# Patient Record
Sex: Female | Born: 1965 | Race: Black or African American | Hispanic: No | Marital: Married | Smoking: Never smoker
Health system: Southern US, Community
[De-identification: ages and names within clinical notes are randomized; demographics above are authoritative.]

---

## 1997-10-31 ENCOUNTER — Observation Stay (HOSPITAL_COMMUNITY): Admission: AD | Admit: 1997-10-31 | Discharge: 1997-10-31 | Payer: Self-pay | Admitting: Obstetrics and Gynecology

## 1997-11-30 ENCOUNTER — Inpatient Hospital Stay (HOSPITAL_COMMUNITY): Admission: AD | Admit: 1997-11-30 | Discharge: 1997-11-30 | Payer: Self-pay | Admitting: Obstetrics and Gynecology

## 1997-12-02 ENCOUNTER — Ambulatory Visit (HOSPITAL_COMMUNITY): Admission: RE | Admit: 1997-12-02 | Discharge: 1997-12-02 | Payer: Self-pay | Admitting: Obstetrics and Gynecology

## 1998-01-15 ENCOUNTER — Inpatient Hospital Stay (HOSPITAL_COMMUNITY): Admission: AD | Admit: 1998-01-15 | Discharge: 1998-01-15 | Payer: Self-pay | Admitting: Obstetrics and Gynecology

## 1998-04-06 ENCOUNTER — Inpatient Hospital Stay (HOSPITAL_COMMUNITY): Admission: AD | Admit: 1998-04-06 | Discharge: 1998-04-08 | Payer: Self-pay | Admitting: *Deleted

## 1998-05-27 ENCOUNTER — Ambulatory Visit (HOSPITAL_COMMUNITY): Admission: RE | Admit: 1998-05-27 | Discharge: 1998-05-27 | Payer: Self-pay | Admitting: Obstetrics and Gynecology

## 1998-06-03 ENCOUNTER — Other Ambulatory Visit: Admission: RE | Admit: 1998-06-03 | Discharge: 1998-06-03 | Payer: Self-pay | Admitting: Obstetrics and Gynecology

## 1999-02-09 ENCOUNTER — Ambulatory Visit (HOSPITAL_BASED_OUTPATIENT_CLINIC_OR_DEPARTMENT_OTHER): Admission: RE | Admit: 1999-02-09 | Discharge: 1999-02-09 | Payer: Self-pay

## 2002-01-25 ENCOUNTER — Encounter: Payer: Self-pay | Admitting: Infectious Diseases

## 2002-01-25 ENCOUNTER — Ambulatory Visit (HOSPITAL_COMMUNITY): Admission: RE | Admit: 2002-01-25 | Discharge: 2002-01-25 | Payer: Self-pay | Admitting: Family Medicine

## 2004-07-27 ENCOUNTER — Other Ambulatory Visit: Admission: RE | Admit: 2004-07-27 | Discharge: 2004-07-27 | Payer: Self-pay | Admitting: Obstetrics and Gynecology

## 2004-11-02 ENCOUNTER — Encounter: Admission: RE | Admit: 2004-11-02 | Discharge: 2004-11-02 | Payer: Self-pay | Admitting: Obstetrics and Gynecology

## 2005-07-07 ENCOUNTER — Ambulatory Visit: Payer: Self-pay | Admitting: Cardiology

## 2005-07-07 ENCOUNTER — Encounter: Payer: Self-pay | Admitting: Cardiology

## 2005-07-07 ENCOUNTER — Inpatient Hospital Stay (HOSPITAL_COMMUNITY): Admission: EM | Admit: 2005-07-07 | Discharge: 2005-07-08 | Payer: Self-pay | Admitting: Emergency Medicine

## 2005-09-11 ENCOUNTER — Emergency Department (HOSPITAL_COMMUNITY): Admission: EM | Admit: 2005-09-11 | Discharge: 2005-09-11 | Payer: Self-pay | Admitting: Emergency Medicine

## 2008-07-24 ENCOUNTER — Encounter: Admission: RE | Admit: 2008-07-24 | Discharge: 2008-07-24 | Payer: Self-pay | Admitting: Obstetrics and Gynecology

## 2010-02-05 ENCOUNTER — Encounter: Admission: RE | Admit: 2010-02-05 | Discharge: 2010-02-05 | Payer: Self-pay | Admitting: Obstetrics and Gynecology

## 2011-02-12 NOTE — H&P (Signed)
Cassie Gillespie, Cassie Gillespie NO.:  [id number]   MEDICAL RECORD NO.:  [id number]          PATIENT TYPE:  INP   LOCATION:  2311                         FACILITY:  [facility]   PHYSICIAN:  Lonia Blood, M.D.       DATE OF BIRTH:  07/17/1966   DATE OF ADMISSION:  07/06/2005  DATE OF DISCHARGE:                                HISTORY & PHYSICAL   CHIEF COMPLAINT:  Puffy eyes.   HISTORY OF PRESENT ILLNESS:  Ms. Wisnewski is a 45 year old, African-American  woman without any significant past medical history.  She put on a different  kind of wig today, and after that she felt that her eyes were getting very  puffy.  She went home, changed the wig, and went back to work.  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  She was  rushed to the emergency room.  In the emergency room, she was thoroughly  evaluated for all possible causes of fever, but the chest x-ray was  negative, abdominal CT was negative, and we were called to admit her for a  fever of unknown origin.  Ms. Brent denies any cough.  Denies any  dysuria.  Denies any rashes.  Reports a sore throat.  She also reports that  her son has recently been sick.  She has never received a flu vaccine.   PAST MEDICAL HISTORY:  1.  Tonsillectomy.  2.  Hiatal hernia.  3.  Bilateral tubal ligation.   MEDICATIONS:  Multivitamins.   ALLERGIES:  No known drug allergies.   FAMILY HISTORY:  Positive for diabetes and hypertension.   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  Does not drink  alcohol.  Does not smoke cigarettes.  Does not use any intravenous drugs.  She is married.   REVIEW OF SYSTEMS:  As per HPI.  The 12 other systems are negative.   PHYSICAL EXAMINATION:  VITAL SIGNS:  Temperature 102.3; pulse 114;  respirations 28; blood pressure 102/46.  GENERAL:  Shows a well-developed, well-nourished woman  in no acute distress.  Alert, oriented to place, person, and time.  HEAD:  She has very swollen upper lip, puffy eyes.  Throat has erythema.  NECK:  Without JVD.  She has positive submandibular adenopathy bilaterally,  small and nontender.  CHEST:  Clear to auscultation bilaterally, without wheezes, rhonchi, or  crackles.  HEART:  Very tachycardiac.  2/6 systolic murmur, most likely a flow murmur.  ABDOMEN:  Soft, nontender, nondistended.  Bowel sounds are present.  She has  no CVA tenderness.  NEUROLOGIC:  Nonfocal.  She has 5/5 strength.  Pupils equal, round, and  reactive to light and accommodation.  Cranial nerves are intact.  SKIN:  There is no appreciable rash at this moment.  PSYCHIATRIC:  She appears to have intact and full memory, affect, judgment,  and insight.   LABORATORY VALUES ON ADMISSION:  White blood cell count 11,000, hemoglobin  12.2.  Urinalysis  within normal limits.  Lipase 18.  CMET within normal  limits.  Urine pregnancy is negative.  A CT of the abdomen shows some  reactive adenopathy.   IMPRESSION AND PLAN:  1.  A febrile illness of unknown clear etiology.  Appears to be a systemic      illness versus localized to the throat.  Differential includes a viral      illness versus a streptococcal illness, versus bacteremia of      unrecognized source.  Plan is to admit to the hospital.  Proceed with      aggressive intravenous fluid hydration, as the patient is hypotensive.      Also start on empiric antibiotics with vancomycin and Rocephin.  Will      also screen for influenza, nasal washings, streptococcal pneumonia      antigen in the urine, streptococcal rapid test from the throat.  DVT      prophylaxis will be done with Lovenox, and GI prophylaxis with Protonix.  2.  Angioedema secondary to an allergic reaction versus infectious process.      Plan is to give some intravenous steroids and follow up for the swelling      in the step-down unit.  The patient is  at risk of obturating the airway      if the edema progresses.      Lonia Blood, M.D.  Electronically Signed     SL/MEDQ  D:  07/07/2005  T:  07/07/2005  Job:  [id number]

## 2011-02-12 NOTE — Discharge Summary (Signed)
Cassie Gillespie, Cassie Gillespie NO.:  [id number]   MEDICAL RECORD NO.:  [id number]          PATIENT TYPE:  INP   LOCATION:  6743                         FACILITY:  [facility]   PHYSICIAN:  Hettie Holstein, D.O.    DATE OF BIRTH:  11-20-1965   DATE OF ADMISSION:  07/06/2005  DATE OF DISCHARGE:  07/09/2005                                 DISCHARGE SUMMARY   ADMISSION DIAGNOSIS:  Allergic reaction.   OTHER DIAGNOSES:  1.  Hypotension.  2.  Metrorrhagia.  3.  Anemia with hemoglobin of 9.7 this admission and MCV of 81.1, will      attempt to add iron studies to samples performed prior to discharge,      however, will defer extensive anemia workup to the outpatient setting as      the patient has had an episode of metrorrhagia, in addition to generous      IV hydration for hypotension and a likely component of hemodilution.  4.  Febrile illness of undetermined etiology status post extensive inpatient      evaluation including CT of the abdomen and pelvis as well as CT      maxillofacial studies, no radiographic evidence or clinical evidence of      intra-abdominal infectious process.  She had some suggestion of antritis      or adenitis on her scan, however, these were nonspecific.  Her CT      maxillofacial study revealed some post extraction findings in addition      to some nonspecific inflammatory findings of parotid glands.  5.  Questionable allergic reaction, it is unclear what the etiology of Ms.      Gillespie presenting complaint was, however, symptoms have resolved      now.  She has undergone extensive inpatient evaluation study.  She      appears to be medically stable for discharge.   STUDIES PERFORMED:  CT scan with findings as described above.  2D  echocardiogram which revealed a normal ejection fraction of 65%.  Her renal  function has remained stable with normal BUN and creatinine throughout her  hospitalization.  Some findings on the maxillofacial study, CT  scan,  revealed some left maxillary sinusitis as well as some asymmetry with  regards to her nonspecific findings.  Her symptoms have resolved at the time  of discharge.   DISCHARGE MEDICATIONS:  Zithromax to completion of therapy and over the  counter Tylenol.   DISPOSITION:  She has no primary care physician, she works here at [employer], we are providing her with contact information for [facility], Dr. Lynne Logan office, to call for a follow up appointment  within 1-2 weeks.  I have provided her with my contact information, fax  information for work, forms that she requested be filled out.  In addition,  I have recommended that she follow up with a gynecologist for this episode  of metrorrhagia and provided her our contact information if she develops any  difficulties over the next few days prior to follow up with a new primary  care Cassie Gillespie.  HISTORY OF PRESENT ILLNESS:  Please refer to the history and physical as  dictated by Dr. Lonia Blood, however, briefly Cassie Gillespie is a 45 year old  female without any significant past medical history.  She stated that after  trying on a wig, she promptly developed a funny sensation, she felt very  sick as though she was running a fever, felt quite weak, and rushed to the  emergency department.  She underwent thorough evaluation.  Her face was  swollen and puffy, according to the department, as well as Dr. Ellin Saba  evaluation.  She is admitted for further evaluation and management.   HOSPITAL COURSE:  She was admitted to the step down ICU due to hypotension  with a diastolic dropping into the 30s and 40s and systolic in the 80s.  She  did respond to IV fluids, she was empirically started on antibiotics and  cultured.  Her cultures revealed no source of infection.  Her imaging  studies were described above and her renal function remained within normal  limits.  I did discuss the CT findings with regards to the post  extraction  findings with Dr. Maryclare Bean.  In addition, I reviewed the parotid gland  inflammation which certainly this could be reactive findings, though I would  recommend clinical follow up with Cassie Gillespie and have requested that she  establish a primary care Cassie Gillespie with [facility].      Hettie Holstein, D.O.  Electronically Signed     ESS/MEDQ  D:  07/08/2005  T:  07/08/2005  Job:  [id number]   cc:   Juline Patch, M.D.  Fax: [phone]

## 2014-03-13 ENCOUNTER — Emergency Department (HOSPITAL_COMMUNITY)
Admission: EM | Admit: 2014-03-13 | Discharge: 2014-03-13 | Disposition: A | Discharge status: Home / Self Care | Payer: Self-pay | Attending: Emergency Medicine | Admitting: Emergency Medicine

## 2014-03-13 ENCOUNTER — Encounter (HOSPITAL_COMMUNITY): Payer: Self-pay | Admitting: Emergency Medicine

## 2014-03-13 ENCOUNTER — Emergency Department (HOSPITAL_COMMUNITY): Payer: Self-pay

## 2014-03-13 DIAGNOSIS — L02519 Cutaneous abscess of unspecified hand: Secondary | ICD-10-CM | POA: Insufficient documentation

## 2014-03-13 DIAGNOSIS — L03019 Cellulitis of unspecified finger: Principal | ICD-10-CM

## 2014-03-13 DIAGNOSIS — L03011 Cellulitis of right finger: Secondary | ICD-10-CM

## 2014-03-13 MED ORDER — CEPHALEXIN 500 MG PO CAPS: 500.0000 mg | ORAL_CAPSULE | Freq: Four times a day (QID) | ORAL | Status: AC

## 2014-03-13 MED ORDER — HYDROCODONE-ACETAMINOPHEN 5-325 MG PO TABS: 1.0000 | ORAL_TABLET | ORAL | Status: AC | PRN

## 2014-03-13 NOTE — ED Provider Notes (Signed)
Medical screening examination/treatment/procedure(s) were performed by non-physician practitioner and as supervising physician I was immediately available for consultation/collaboration.   EKG Interpretation None        Forrest S Harrison, MD 03/13/14 1615 

## 2014-03-13 NOTE — ED Notes (Signed)
Pt c/o rt sided thumb swelling since yesterday.  Denies getting bitten by anything and denies injury.

## 2014-03-13 NOTE — Discharge Instructions (Signed)
Take Keflex as directed until gone. Take vicodin as needed for pain. Refer to attached documents for more information. Return to the ED with worsening or concerning symptoms.

## 2014-03-13 NOTE — ED Provider Notes (Signed)
CSN: [id number]     Arrival date & time 03/13/14  [id number] History   First MD Initiated Contact with Patient 03/13/14 1057     Chief Complaint  Patient presents with  . Joint Swelling     (Consider location/radiation/quality/duration/timing/severity/associated sxs/prior Treatment) HPI Comments: Patient is a 48 year old female with no past medical history who presents with right thumb pain that started yesterday. Symptoms started gradually and progressively worsened since the onset. The pain is throbbing and severe without radiation. Patient denies any injury. Patient tried putting ice on her thumb which provided no relief. Patient reports associated swelling. Patient denies injury.    History reviewed. No pertinent past medical history. No past surgical history on file. No family history on file. History  Substance Use Topics  . Smoking status: Never Smoker   . Smokeless tobacco: Not on file  . Alcohol Use: No   OB History   Grav Para Term Preterm Abortions TAB SAB Ect Mult Living                 Review of Systems  Constitutional: Negative for fever, chills and fatigue.  HENT: Negative for trouble swallowing.   Eyes: Negative for visual disturbance.  Respiratory: Negative for shortness of breath.   Cardiovascular: Negative for chest pain and palpitations.  Gastrointestinal: Negative for nausea, vomiting, abdominal pain and diarrhea.  Genitourinary: Negative for dysuria and difficulty urinating.  Musculoskeletal: Positive for joint swelling. Negative for arthralgias and neck pain.  Skin: Positive for color change.  Neurological: Negative for dizziness and weakness.  Psychiatric/Behavioral: Negative for dysphoric mood.      Allergies  Sulfa antibiotics  Home Medications   Prior to Admission medications   Not on File   BP 121/79  Pulse 76  Temp(Src) 98.5 F (36.9 C) (Oral)  Resp 14  SpO2 97% Physical Exam  Nursing note and vitals reviewed. Constitutional: She is  oriented to person, place, and time. She appears well-developed and well-nourished. No distress.  HENT:  Head: Normocephalic and atraumatic.  Eyes: Conjunctivae are normal.  Neck: Normal range of motion.  Cardiovascular: Normal rate and regular rhythm.  Exam reveals no gallop and no friction rub.   No murmur heard. Pulmonary/Chest: Effort normal and breath sounds normal. She has no wheezes. She has no rales. She exhibits no tenderness.  Musculoskeletal:  Generalized right thumb edema and tenderness to palpation. No obvious deformity.   Neurological: She is alert and oriented to person, place, and time. Coordination normal.  Speech is goal-oriented. Moves limbs without ataxia.   Skin: Skin is warm and dry.  Warm and erythematous skin overlying right thumb without open wound.   Psychiatric: She has a normal mood and affect. Her behavior is normal.    ED Course  Procedures (including critical care time) Labs Review Labs Reviewed - No data to display  Imaging Review Dg Finger Thumb Right  03/13/2014   CLINICAL DATA:  Swelling and pain.  EXAM: RIGHT THUMB 2+V  COMPARISON:  None.  FINDINGS: Soft tissue swelling is present. A very subtle fracture along the base of the distal phalanx of the thumb cannot be excluded. No significant displaced fractures identified. No radiopaque foreign bodies.  IMPRESSION: 1. Diffuse soft tissue swelling.  No radiopaque foreign bodies. 2. Cannot exclude extremely subtle nondisplaced fracture base of the left thumb distal phalanx.   Electronically Signed   By: Maisie Fushomas  Register   On: 03/13/2014 11:13     EKG Interpretation None  MDM   Final diagnoses:  Cellulitis of right thumb    11:22 AM Patient has cellulitis of the right thumb. Xray shows soft tissue swelling without fracture. No open wound noted. Patient will be discharged with Bactrim, Keflex, and Vicodin. Vitals stable and patient afebrile.    Emilia BeckKaitlyn Szekalski, PA-C 03/13/14 1129

## 2014-03-13 NOTE — Progress Notes (Signed)
[facility] provided pt with a list of primary care resources and a [facility] [employer] Card application to help patient establish primary care.  °

## 2016-01-15 IMAGING — CR DG FINGER THUMB 2+V*R*
3 series · 3 of 3 positions shown · non-contrast
Comparison: None.

CLINICAL DATA: Swelling and pain.

EXAM:
RIGHT THUMB 2+V

[x finger obl right]
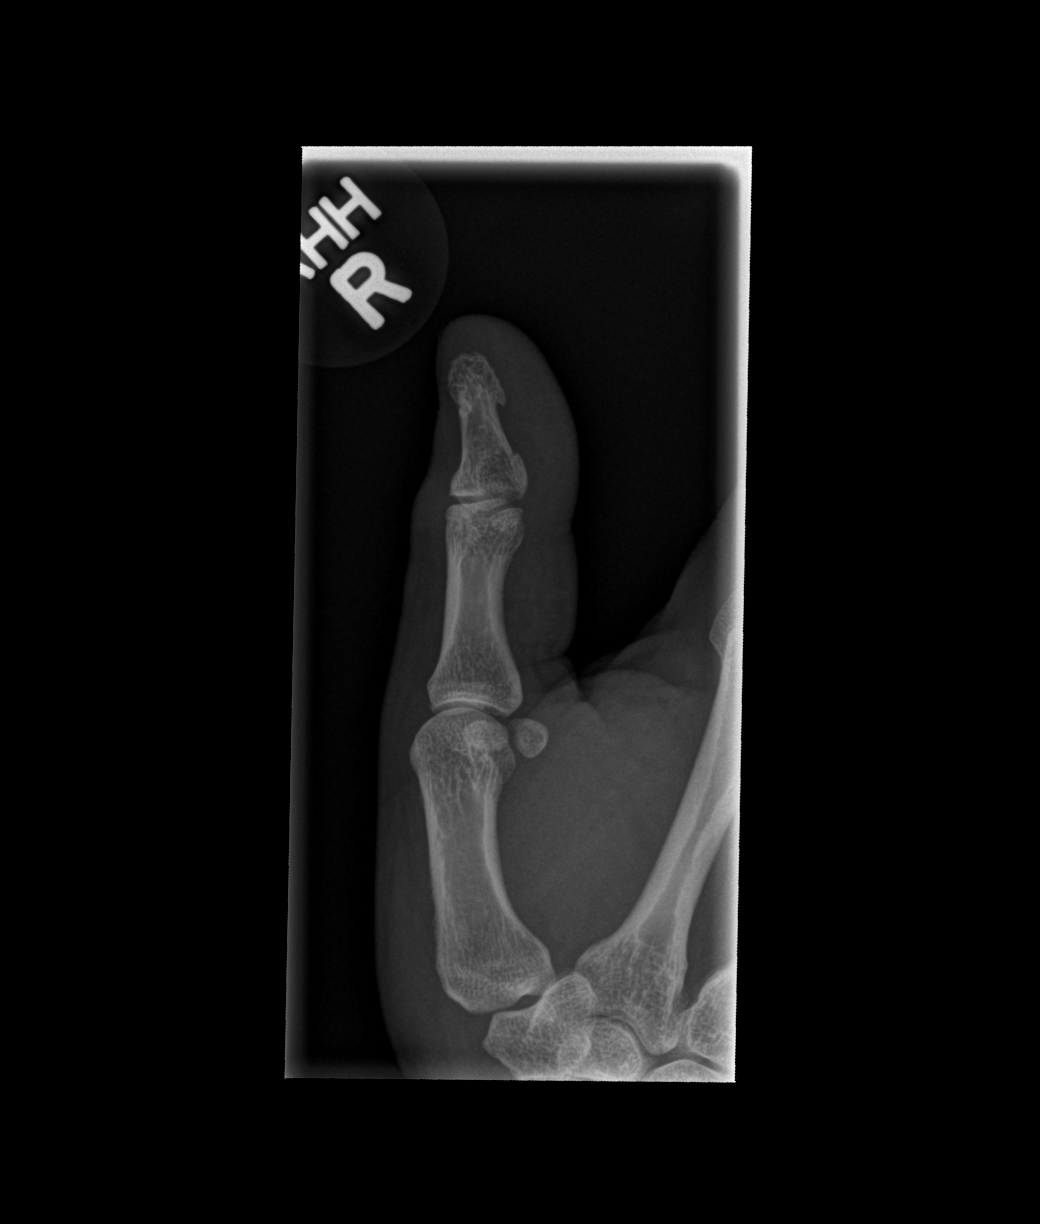

[x finger lat right]
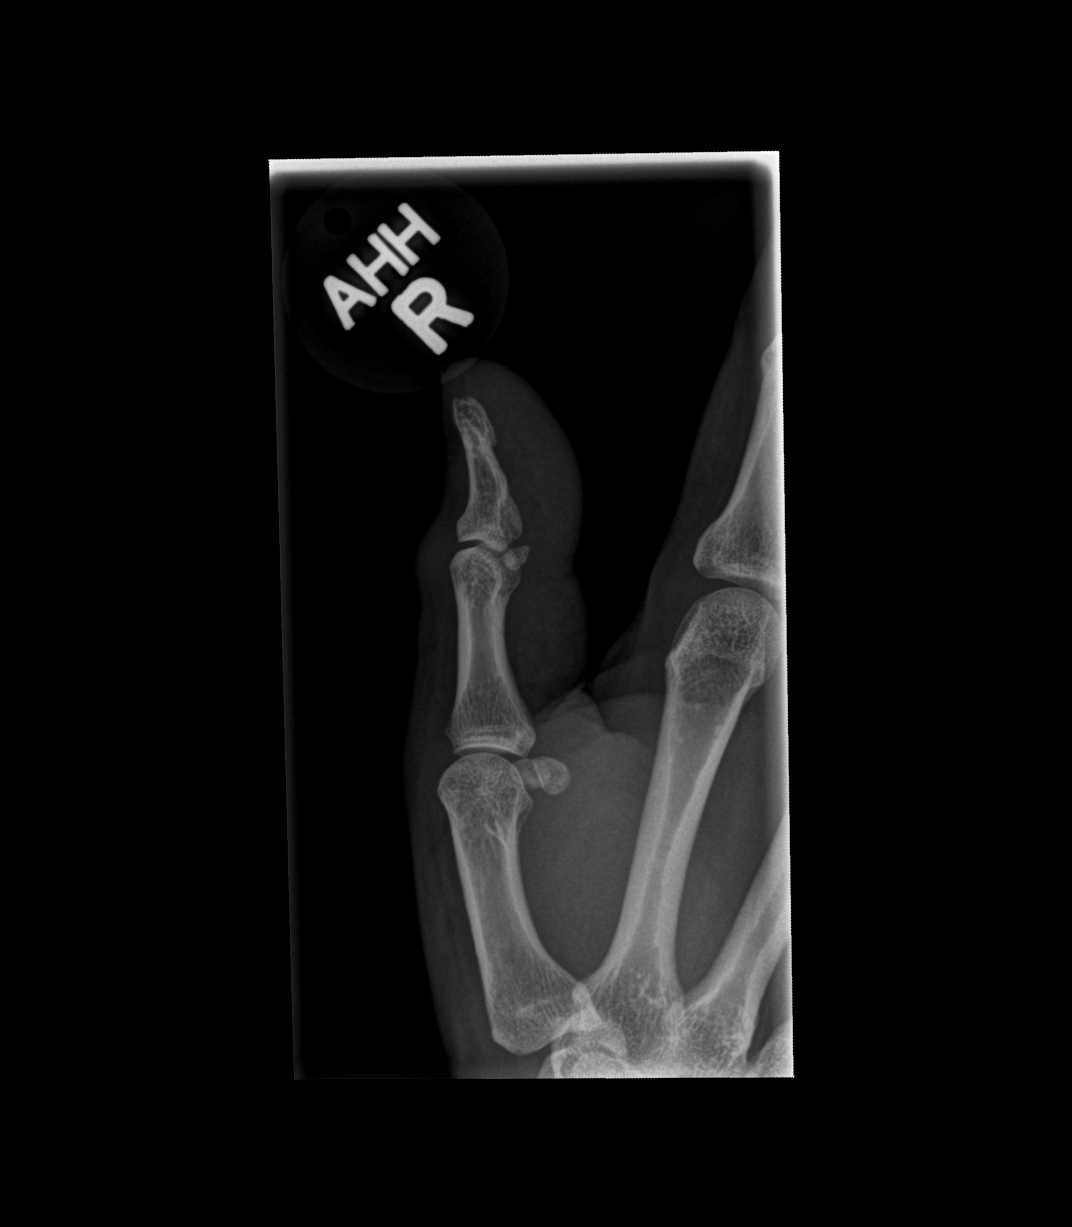

[x finger pa right]
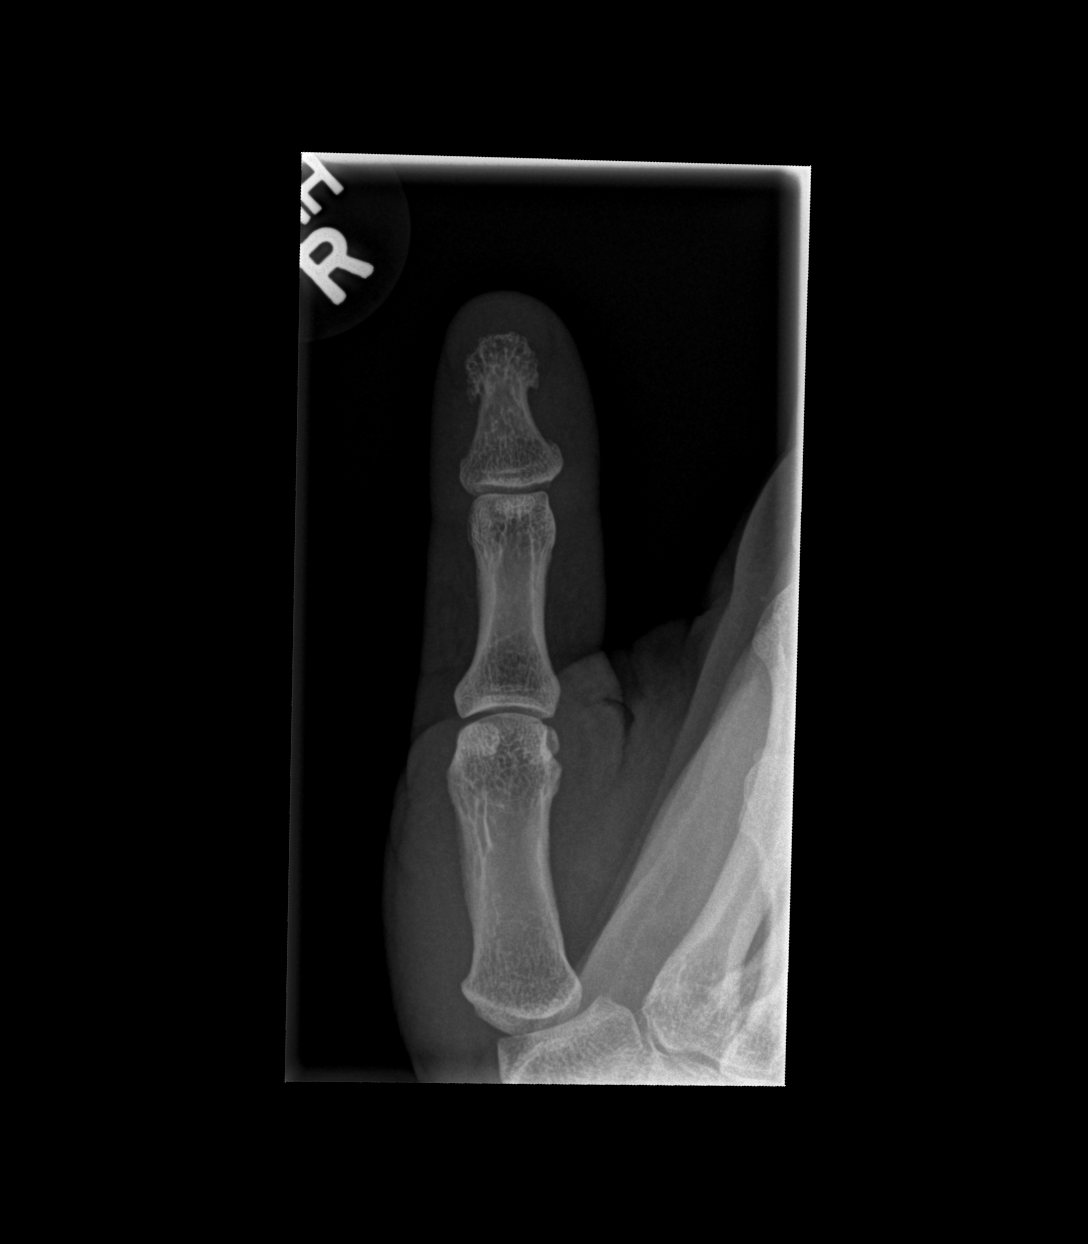

[3 of 3 positions shown; findings below may reference images not displayed]

FINDINGS: Soft tissue swelling is present. A very subtle fracture along the
base of the distal phalanx of the thumb cannot be excluded. No
significant displaced fractures identified. No radiopaque foreign
bodies.
IMPRESSION: 1. Diffuse soft tissue swelling.  No radiopaque foreign bodies.
2. Cannot exclude extremely subtle nondisplaced fracture base of the
left thumb distal phalanx.

## 2016-03-25 ENCOUNTER — Other Ambulatory Visit: Payer: Self-pay | Admitting: Family Medicine

## 2016-03-25 DIAGNOSIS — Z1231 Encounter for screening mammogram for malignant neoplasm of breast: Secondary | ICD-10-CM

## 2016-05-05 ENCOUNTER — Ambulatory Visit
Admission: RE | Admit: 2016-05-05 | Discharge: 2016-05-05 | Disposition: A | Discharge status: Home / Self Care | Payer: BLUE CROSS/BLUE SHIELD | Source: Ambulatory Visit | Attending: Family Medicine | Admitting: Family Medicine

## 2016-05-05 DIAGNOSIS — Z1231 Encounter for screening mammogram for malignant neoplasm of breast: Secondary | ICD-10-CM

## 2022-07-28 ENCOUNTER — Other Ambulatory Visit (HOSPITAL_COMMUNITY): Payer: Self-pay

## 2022-07-28 MED ORDER — BUPROPION HCL 75 MG PO TABS
75.0000 mg | ORAL_TABLET | Freq: Two times a day (BID) | ORAL | 0 refills | Status: AC
  Filled 2022-07-28: qty 60, 30d supply, fill #0

## 2022-07-28 MED ORDER — WEGOVY 0.25 MG/0.5ML ~~LOC~~ SOAJ
0.2500 mg | SUBCUTANEOUS | 0 refills | Status: DC
  Filled 2022-07-28 – 2022-10-11 (×2): qty 2, 28d supply, fill #0

## 2022-07-30 ENCOUNTER — Other Ambulatory Visit (HOSPITAL_COMMUNITY): Payer: Self-pay

## 2022-08-04 ENCOUNTER — Other Ambulatory Visit (HOSPITAL_COMMUNITY): Payer: Self-pay

## 2022-08-23 ENCOUNTER — Other Ambulatory Visit (HOSPITAL_COMMUNITY): Payer: Self-pay

## 2022-08-29 ENCOUNTER — Encounter (HOSPITAL_BASED_OUTPATIENT_CLINIC_OR_DEPARTMENT_OTHER): Payer: Self-pay | Admitting: Emergency Medicine

## 2022-08-29 ENCOUNTER — Emergency Department (HOSPITAL_BASED_OUTPATIENT_CLINIC_OR_DEPARTMENT_OTHER)
Admission: EM | Admit: 2022-08-29 | Discharge: 2022-08-30 | Disposition: A | Discharge status: Home / Self Care | Payer: 59 | Attending: Emergency Medicine | Admitting: Emergency Medicine

## 2022-08-29 ENCOUNTER — Other Ambulatory Visit: Payer: Self-pay

## 2022-08-29 DIAGNOSIS — Z794 Long term (current) use of insulin: Secondary | ICD-10-CM | POA: Insufficient documentation

## 2022-08-29 DIAGNOSIS — I959 Hypotension, unspecified: Secondary | ICD-10-CM | POA: Insufficient documentation

## 2022-08-29 DIAGNOSIS — R531 Weakness: Secondary | ICD-10-CM | POA: Insufficient documentation

## 2022-08-29 DIAGNOSIS — R42 Dizziness and giddiness: Secondary | ICD-10-CM | POA: Diagnosis present

## 2022-08-29 LAB — CBC
HCT: 33.5 % — ABNORMAL LOW (ref 36.0–46.0)
Hemoglobin: 10.4 g/dL — ABNORMAL LOW (ref 12.0–15.0)
MCH: 25.2 pg — ABNORMAL LOW (ref 26.0–34.0)
MCHC: 31 g/dL (ref 30.0–36.0)
MCV: 81.3 fL (ref 80.0–100.0)
Platelets: 406 10*3/uL — ABNORMAL HIGH (ref 150–400)
RBC: 4.12 MIL/uL (ref 3.87–5.11)
RDW: 15.4 % (ref 11.5–15.5)
WBC: 7.2 10*3/uL (ref 4.0–10.5)
nRBC: 0 % (ref 0.0–0.2)

## 2022-08-29 LAB — COMPREHENSIVE METABOLIC PANEL
ALT: 20 U/L (ref 0–44)
AST: 13 U/L — ABNORMAL LOW (ref 15–41)
Albumin: 3.9 g/dL (ref 3.5–5.0)
Alkaline Phosphatase: 66 U/L (ref 38–126)
Anion gap: 9 (ref 5–15)
BUN: 15 mg/dL (ref 6–20)
CO2: 26 mmol/L (ref 22–32)
Calcium: 9.2 mg/dL (ref 8.9–10.3)
Chloride: 102 mmol/L (ref 98–111)
Creatinine, Ser: 0.56 mg/dL (ref 0.44–1.00)
GFR, Estimated: >60 mL/min (ref >60)
Glucose, Bld: 115 mg/dL — ABNORMAL HIGH (ref 70–99)
Potassium: 3.8 mmol/L (ref 3.5–5.1)
Sodium: 137 mmol/L (ref 135–145)
Total Bilirubin: 0.3 mg/dL (ref 0.3–1.2)
Total Protein: 7.8 g/dL (ref 6.5–8.1)

## 2022-08-29 LAB — CBG MONITORING, ED: Glucose-Capillary: 116 mg/dL — ABNORMAL HIGH (ref 70–99)

## 2022-08-29 NOTE — ED Provider Notes (Signed)
[facility] EMERGENCY DEPT  Provider Note  CSN: [id number] Arrival date & time: 08/29/22 2039  History Chief Complaint  Patient presents with   Weakness    Cassie Gillespie is a 56 y.o. female presents for evaluation of dizziness. She works night shift (7p-7a) and had been asleep all day. She states she began to feel dizzy while in bed just before she was to wake up and get ready for work. She is unable to further describe the dizziness but does not report room spinning. She states she felt generally weak when she tried to get out of bed and she had to stop and rest while getting dressed. Her husband at bedside reports he checked her BP and it was low 89/55 which is unusual for her. She has not been sick recently, no fever, cough SOB, N/V/D or dysuria. She had some chest discomfort she attributed to GERD which has mostly resolved. She is feeling better now. Was able to get up and walk to the bathroom without dizziness prior to my evaluation. She thinks she may have accidentally taken an extra wellbutrin earlier in the day before going to bed.    Home Medications Prior to Admission medications   Medication Sig Start Date End Date Taking? Authorizing Provider  buPROPion (WELLBUTRIN) 75 MG tablet Take 1 tablet (75 mg total) by mouth 2 (two) times daily. 07/28/22     cephALEXin (KEFLEX) 500 MG capsule Take 1 capsule (500 mg total) by mouth 4 (four) times daily. 03/13/14   Emilia Beck, PA-C  HYDROcodone-acetaminophen (NORCO/VICODIN) 5-325 MG per tablet Take 1-2 tablets by mouth every 4 (four) hours as needed for moderate pain or severe pain. 03/13/14   Emilia Beck, PA-C  Semaglutide-Weight Management (WEGOVY) 0.25 MG/0.5ML SOAJ Inject 0.25 mg into the skin once a week. 07/28/22        Allergies    Sulfa antibiotics   Review of Systems   Review of Systems Please see HPI for pertinent positives and negatives  Physical Exam BP 122/70 (BP Location: Left Arm)   Pulse  88   Temp 98.4 F (36.9 C) (Oral)   Resp 18   SpO2 97%   Physical Exam Vitals and nursing note reviewed.  Constitutional:      Appearance: Normal appearance.  HENT:     Head: Normocephalic and atraumatic.     Nose: Nose normal.     Mouth/Throat:     Mouth: Mucous membranes are moist.  Eyes:     Extraocular Movements: Extraocular movements intact.     Conjunctiva/sclera: Conjunctivae normal.  Cardiovascular:     Rate and Rhythm: Normal rate.  Pulmonary:     Effort: Pulmonary effort is normal.     Breath sounds: Normal breath sounds.  Abdominal:     General: Abdomen is flat.     Palpations: Abdomen is soft.     Tenderness: There is no abdominal tenderness.  Musculoskeletal:        General: No swelling. Normal range of motion.     Cervical back: Neck supple.  Skin:    General: Skin is warm and dry.  Neurological:     General: No focal deficit present.     Mental Status: She is alert.  Psychiatric:        Mood and Affect: Mood normal.     ED Results / Procedures / Treatments   EKG None  Procedures Procedures  Medications Ordered in the ED Medications - No data to display  Initial Impression  and Plan  Patient here for nonspecific dizziness and weakness with reportedly low BP at home. Vitals here have been normal and her symptoms have improved. Labs done in triage show a CBC with mild anemia, no old to compare but no indication of hemorrhage as a cause of her symptoms. Chemistry is unremarkable. Given her symptoms, will add EKG and Trop, given the timing of her symptoms a single Trop should be sufficient to rule out AMI.  ED Course   Clinical Course as of 08/30/22 0052  [facility] Aug 30, 2022  0039 EKG not in MUSE, shows NSR, Rate 89, no ischemic changes [CS]  0044 Trop is normal. Patient able to ambulate with a steady gait. Vitals remain normal. Plan discharge home with PCP follow up. Advised to rest, drink plenty of fluids and RTED if symptoms worsen.  [CS]     Clinical Course User Index [CS] Pollyann Savoy, MD     MDM Rules/Calculators/A&P Medical Decision Making Given presenting complaint, I considered that admission might be necessary. After review of results from ED lab and/or imaging studies, admission to the hospital is not indicated at this time.    Problems Addressed: Generalized weakness: acute illness or injury that poses a threat to life or bodily functions Transient hypotension: acute illness or injury that poses a threat to life or bodily functions  Amount and/or Complexity of Data Reviewed Labs: ordered. Decision-making details documented in ED Course. ECG/medicine tests: ordered and independent interpretation performed. Decision-making details documented in ED Course.  Risk Decision regarding hospitalization.    Final Clinical Impression(s) / ED Diagnoses Final diagnoses:  Generalized weakness  Transient hypotension    Rx / DC Orders ED Discharge Orders     None        Pollyann Savoy, MD 08/30/22 [id number]

## 2022-08-29 NOTE — ED Triage Notes (Signed)
Pt here from home with c/o weakness and hypotension at home , b/p fine in triage , may have took an extra Wellbutrin

## 2022-08-30 LAB — TROPONIN I (HIGH SENSITIVITY): Troponin I (High Sensitivity): <2 ng/L (ref <18)

## 2022-09-02 ENCOUNTER — Other Ambulatory Visit (HOSPITAL_COMMUNITY): Payer: Self-pay

## 2022-09-24 ENCOUNTER — Other Ambulatory Visit (HOSPITAL_COMMUNITY): Payer: Self-pay

## 2022-10-11 ENCOUNTER — Other Ambulatory Visit (HOSPITAL_COMMUNITY): Payer: Self-pay

## 2022-10-13 ENCOUNTER — Other Ambulatory Visit: Payer: Self-pay

## 2022-10-14 ENCOUNTER — Other Ambulatory Visit (HOSPITAL_COMMUNITY): Payer: Self-pay

## 2022-10-26 ENCOUNTER — Other Ambulatory Visit (HOSPITAL_COMMUNITY): Payer: Self-pay

## 2022-10-26 MED ORDER — WEGOVY 0.5 MG/0.5ML ~~LOC~~ SOAJ
SUBCUTANEOUS | 0 refills | Status: DC
  Filled 2022-10-26: qty 2, 28d supply, fill #0

## 2022-10-29 ENCOUNTER — Other Ambulatory Visit (HOSPITAL_COMMUNITY): Payer: Self-pay

## 2022-11-05 ENCOUNTER — Other Ambulatory Visit (HOSPITAL_COMMUNITY): Payer: Self-pay

## 2022-11-12 ENCOUNTER — Other Ambulatory Visit (HOSPITAL_COMMUNITY): Payer: Self-pay

## 2022-11-12 MED ORDER — WEGOVY 0.25 MG/0.5ML ~~LOC~~ SOAJ
SUBCUTANEOUS | 0 refills | Status: AC
  Filled 2022-11-12: qty 2, 28d supply, fill #0

## 2022-11-15 ENCOUNTER — Other Ambulatory Visit (HOSPITAL_COMMUNITY): Payer: Self-pay

## 2022-11-15 ENCOUNTER — Other Ambulatory Visit: Payer: Self-pay

## 2022-11-17 ENCOUNTER — Other Ambulatory Visit (HOSPITAL_COMMUNITY): Payer: Self-pay

## 2022-12-31 ENCOUNTER — Other Ambulatory Visit (HOSPITAL_COMMUNITY): Payer: Self-pay

## 2022-12-31 MED ORDER — WEGOVY 0.5 MG/0.5ML ~~LOC~~ SOAJ
0.5000 mg | SUBCUTANEOUS | 0 refills | Status: AC
  Filled 2022-12-31: qty 2, 28d supply, fill #0

## 2023-01-31 ENCOUNTER — Other Ambulatory Visit (HOSPITAL_COMMUNITY): Payer: Self-pay

## 2023-01-31 MED ORDER — BUPROPION HCL 75 MG PO TABS
75.0000 mg | ORAL_TABLET | Freq: Two times a day (BID) | ORAL | 1 refills | Status: AC
  Filled 2023-01-31: qty 60, 30d supply, fill #0

## 2023-01-31 MED ORDER — WEGOVY 1 MG/0.5ML ~~LOC~~ SOAJ
1.0000 mg | SUBCUTANEOUS | 0 refills | Status: DC
  Filled 2023-01-31 – 2023-02-03 (×2): qty 2, 28d supply, fill #0

## 2023-02-03 ENCOUNTER — Other Ambulatory Visit (HOSPITAL_COMMUNITY): Payer: Self-pay

## 2023-02-04 ENCOUNTER — Other Ambulatory Visit (HOSPITAL_COMMUNITY): Payer: Self-pay

## 2023-02-07 ENCOUNTER — Other Ambulatory Visit (HOSPITAL_COMMUNITY): Payer: Self-pay

## 2023-02-17 ENCOUNTER — Other Ambulatory Visit (HOSPITAL_COMMUNITY): Payer: Self-pay

## 2023-03-02 ENCOUNTER — Other Ambulatory Visit (HOSPITAL_COMMUNITY): Payer: Self-pay

## 2023-03-02 MED ORDER — BUPROPION HCL 75 MG PO TABS
75.0000 mg | ORAL_TABLET | Freq: Two times a day (BID) | ORAL | 1 refills | Status: AC
  Filled 2023-03-02: qty 60, 30d supply, fill #0

## 2023-03-02 MED ORDER — HEMOCYTE-F 324-1 MG PO TABS
1.0000 | ORAL_TABLET | Freq: Every day | ORAL | 1 refills | Status: AC
  Filled 2023-03-02: qty 30, 30d supply, fill #0

## 2023-03-02 MED ORDER — WEGOVY 1 MG/0.5ML ~~LOC~~ SOAJ
1.0000 mg | SUBCUTANEOUS | 0 refills | Status: DC
  Filled 2023-03-02: qty 2, 28d supply, fill #0

## 2023-03-04 ENCOUNTER — Other Ambulatory Visit (HOSPITAL_COMMUNITY): Payer: Self-pay

## 2023-04-01 ENCOUNTER — Other Ambulatory Visit (HOSPITAL_COMMUNITY): Payer: Self-pay

## 2023-04-01 MED ORDER — WEGOVY 1 MG/0.5ML ~~LOC~~ SOAJ
1.0000 mg | SUBCUTANEOUS | 0 refills | Status: AC
  Filled 2023-04-01: qty 4, 56d supply, fill #0
  Filled 2023-04-01: qty 2, 28d supply, fill #0

## 2023-04-06 ENCOUNTER — Other Ambulatory Visit (HOSPITAL_COMMUNITY): Payer: Self-pay

## 2023-04-29 ENCOUNTER — Other Ambulatory Visit (HOSPITAL_COMMUNITY): Payer: Self-pay

## 2023-04-29 ENCOUNTER — Other Ambulatory Visit: Payer: Self-pay

## 2023-04-29 MED ORDER — WEGOVY 1 MG/0.5ML ~~LOC~~ SOAJ
1.0000 mg | SUBCUTANEOUS | 0 refills | Status: AC
  Filled 2023-04-29: qty 2, 28d supply, fill #0

## 2023-04-29 MED ORDER — BUPROPION HCL 75 MG PO TABS
75.0000 mg | ORAL_TABLET | Freq: Two times a day (BID) | ORAL | 1 refills | Status: AC
  Filled 2023-04-29: qty 60, 30d supply, fill #0

## 2023-05-31 ENCOUNTER — Other Ambulatory Visit (HOSPITAL_COMMUNITY): Payer: Self-pay

## 2023-05-31 MED ORDER — WEGOVY 1.7 MG/0.75ML ~~LOC~~ SOAJ
1.7000 mg | SUBCUTANEOUS | 0 refills | Status: DC
  Filled 2023-05-31: qty 3, 28d supply, fill #0

## 2023-06-03 ENCOUNTER — Other Ambulatory Visit (HOSPITAL_COMMUNITY): Payer: Self-pay

## 2023-06-30 ENCOUNTER — Other Ambulatory Visit (HOSPITAL_COMMUNITY): Payer: Self-pay

## 2023-06-30 MED ORDER — BUPROPION HCL 75 MG PO TABS
75.0000 mg | ORAL_TABLET | Freq: Two times a day (BID) | ORAL | 1 refills | Status: AC
  Filled 2023-06-30: qty 60, 30d supply, fill #0

## 2023-06-30 MED ORDER — WEGOVY 1.7 MG/0.75ML ~~LOC~~ SOAJ
1.7000 mg | SUBCUTANEOUS | 0 refills | Status: DC
  Filled 2023-06-30: qty 3, 28d supply, fill #0

## 2023-07-08 ENCOUNTER — Other Ambulatory Visit (HOSPITAL_COMMUNITY): Payer: Self-pay

## 2023-07-29 ENCOUNTER — Other Ambulatory Visit (HOSPITAL_COMMUNITY): Payer: Self-pay

## 2023-07-29 MED ORDER — WEGOVY 1.7 MG/0.75ML ~~LOC~~ SOAJ
1.7000 mg | SUBCUTANEOUS | 0 refills | Status: DC
  Filled 2023-07-29: qty 3, 28d supply, fill #0

## 2023-07-29 MED ORDER — BUPROPION HCL 75 MG PO TABS
75.0000 mg | ORAL_TABLET | Freq: Two times a day (BID) | ORAL | 1 refills | Status: AC
  Filled 2023-07-29: qty 60, 30d supply, fill #0

## 2023-07-30 ENCOUNTER — Other Ambulatory Visit (HOSPITAL_COMMUNITY): Payer: Self-pay

## 2023-09-02 ENCOUNTER — Other Ambulatory Visit (HOSPITAL_COMMUNITY): Payer: Self-pay

## 2023-09-02 MED ORDER — WEGOVY 1.7 MG/0.75ML ~~LOC~~ SOAJ
1.7000 mg | SUBCUTANEOUS | 0 refills | Status: DC
  Filled 2023-09-02: qty 3, 28d supply, fill #0

## 2023-09-02 MED ORDER — BUPROPION HCL ER (SR) 100 MG PO TB12
100.0000 mg | ORAL_TABLET | Freq: Two times a day (BID) | ORAL | 1 refills | Status: AC
  Filled 2023-09-02: qty 60, 30d supply, fill #0

## 2023-10-07 ENCOUNTER — Other Ambulatory Visit (HOSPITAL_BASED_OUTPATIENT_CLINIC_OR_DEPARTMENT_OTHER): Payer: Self-pay

## 2023-10-07 ENCOUNTER — Other Ambulatory Visit (HOSPITAL_COMMUNITY): Payer: Self-pay

## 2023-10-07 MED ORDER — BUPROPION HCL ER (SR) 100 MG PO TB12
100.0000 mg | ORAL_TABLET | Freq: Two times a day (BID) | ORAL | 1 refills | Status: AC
  Filled 2023-10-07: qty 60, 30d supply, fill #0

## 2023-10-07 MED ORDER — WEGOVY 1.7 MG/0.75ML ~~LOC~~ SOAJ
1.7000 mg | SUBCUTANEOUS | 0 refills | Status: AC
  Filled 2023-10-07: qty 3, 28d supply, fill #0

## 2023-10-11 ENCOUNTER — Other Ambulatory Visit (HOSPITAL_COMMUNITY): Payer: Self-pay

## 2023-10-12 ENCOUNTER — Other Ambulatory Visit (HOSPITAL_COMMUNITY): Payer: Self-pay

## 2023-11-07 ENCOUNTER — Other Ambulatory Visit (HOSPITAL_COMMUNITY): Payer: Self-pay

## 2023-11-07 MED ORDER — BUPROPION HCL ER (SR) 100 MG PO TB12
100.0000 mg | ORAL_TABLET | Freq: Two times a day (BID) | ORAL | 1 refills | Status: AC
  Filled 2023-11-07: qty 60, 30d supply, fill #0

## 2023-11-07 MED ORDER — WEGOVY 2.4 MG/0.75ML ~~LOC~~ SOAJ
2.4000 mg | SUBCUTANEOUS | 0 refills | Status: DC
  Filled 2023-11-07: qty 3, 28d supply, fill #0

## 2023-11-08 ENCOUNTER — Other Ambulatory Visit (HOSPITAL_COMMUNITY): Payer: Self-pay

## 2023-12-05 ENCOUNTER — Other Ambulatory Visit (HOSPITAL_COMMUNITY): Payer: Self-pay

## 2023-12-05 MED ORDER — WEGOVY 2.4 MG/0.75ML ~~LOC~~ SOAJ
2.4000 mg | SUBCUTANEOUS | 0 refills | Status: DC
Start: 2023-12-05 — End: 2024-01-04
  Filled 2023-12-05: qty 3, 28d supply, fill #0

## 2023-12-06 ENCOUNTER — Other Ambulatory Visit (HOSPITAL_COMMUNITY): Payer: Self-pay

## 2023-12-07 ENCOUNTER — Other Ambulatory Visit (HOSPITAL_COMMUNITY): Payer: Self-pay

## 2024-01-04 ENCOUNTER — Other Ambulatory Visit (HOSPITAL_COMMUNITY): Payer: Self-pay

## 2024-01-04 MED ORDER — WEGOVY 2.4 MG/0.75ML ~~LOC~~ SOAJ
2.4000 mg | SUBCUTANEOUS | 0 refills | Status: DC
  Filled 2024-01-04: qty 3, 28d supply, fill #0

## 2024-01-04 MED ORDER — BUPROPION HCL ER (SR) 100 MG PO TB12
100.0000 mg | ORAL_TABLET | Freq: Two times a day (BID) | ORAL | 1 refills | Status: AC
Start: 2024-01-04 — End: ?
  Filled 2024-01-04: qty 60, 30d supply, fill #0

## 2024-01-06 ENCOUNTER — Other Ambulatory Visit (HOSPITAL_COMMUNITY): Payer: Self-pay

## 2024-02-03 ENCOUNTER — Other Ambulatory Visit (HOSPITAL_COMMUNITY): Payer: Self-pay

## 2024-02-03 MED ORDER — WEGOVY 2.4 MG/0.75ML ~~LOC~~ SOAJ
2.4000 mg | SUBCUTANEOUS | 0 refills | Status: DC
Start: 2024-02-03 — End: 2024-03-06
  Filled 2024-02-03: qty 3, 28d supply, fill #0

## 2024-02-09 ENCOUNTER — Other Ambulatory Visit (HOSPITAL_COMMUNITY): Payer: Self-pay

## 2024-03-06 ENCOUNTER — Other Ambulatory Visit: Payer: Self-pay

## 2024-03-06 ENCOUNTER — Other Ambulatory Visit (HOSPITAL_COMMUNITY): Payer: Self-pay

## 2024-03-06 MED ORDER — WEGOVY 2.4 MG/0.75ML ~~LOC~~ SOAJ
2.4000 mg | SUBCUTANEOUS | 0 refills | Status: DC
  Filled 2024-03-06 – 2024-03-19 (×2): qty 3, 28d supply, fill #0

## 2024-03-06 MED ORDER — BUPROPION HCL ER (SR) 100 MG PO TB12
100.0000 mg | ORAL_TABLET | Freq: Two times a day (BID) | ORAL | 1 refills | Status: AC
  Filled 2024-03-06 – 2024-03-19 (×2): qty 60, 30d supply, fill #0

## 2024-03-15 ENCOUNTER — Other Ambulatory Visit (HOSPITAL_COMMUNITY): Payer: Self-pay

## 2024-03-19 ENCOUNTER — Other Ambulatory Visit (HOSPITAL_COMMUNITY): Payer: Self-pay

## 2024-03-28 ENCOUNTER — Other Ambulatory Visit (HOSPITAL_COMMUNITY): Payer: Self-pay

## 2024-03-28 MED ORDER — HEMOCYTE-F 324-1 MG PO TABS
324.0000 mg | ORAL_TABLET | Freq: Every day | ORAL | 1 refills | Status: AC
  Filled 2024-03-28 – 2024-04-13 (×2): qty 30, 30d supply, fill #0
  Filled 2024-06-15: qty 30, 30d supply, fill #1
  Filled 2024-07-13 – 2024-08-17 (×2): qty 30, 30d supply, fill #2
  Filled 2024-10-08: qty 90, 90d supply, fill #3

## 2024-03-28 MED ORDER — NA SULFATE-K SULFATE-MG SULF 17.5-3.13-1.6 GM/177ML PO SOLN
1.0000 | ORAL | 0 refills | Status: AC
  Filled 2024-03-28 – 2024-04-13 (×2): qty 1, 1d supply, fill #0

## 2024-04-06 ENCOUNTER — Other Ambulatory Visit (HOSPITAL_COMMUNITY): Payer: Self-pay

## 2024-04-06 MED ORDER — WEGOVY 2.4 MG/0.75ML ~~LOC~~ SOAJ
2.4000 mg | SUBCUTANEOUS | 0 refills | Status: DC
  Filled 2024-04-06 – 2024-04-13 (×2): qty 3, 28d supply, fill #0

## 2024-04-13 ENCOUNTER — Other Ambulatory Visit (HOSPITAL_COMMUNITY): Payer: Self-pay

## 2024-04-13 ENCOUNTER — Other Ambulatory Visit: Payer: Self-pay

## 2024-04-20 ENCOUNTER — Other Ambulatory Visit (HOSPITAL_COMMUNITY): Payer: Self-pay

## 2024-05-08 ENCOUNTER — Other Ambulatory Visit (HOSPITAL_COMMUNITY): Payer: Self-pay

## 2024-05-08 MED ORDER — WEGOVY 2.4 MG/0.75ML ~~LOC~~ SOAJ
2.4000 mg | SUBCUTANEOUS | 0 refills | Status: AC
  Filled 2024-05-08: qty 3, 28d supply, fill #0

## 2024-05-11 ENCOUNTER — Other Ambulatory Visit (HOSPITAL_COMMUNITY): Payer: Self-pay

## 2024-05-11 MED ORDER — FLUCONAZOLE 150 MG PO TABS
150.0000 mg | ORAL_TABLET | Freq: Once | ORAL | 0 refills | Status: AC
  Filled 2024-05-11: qty 1, 1d supply, fill #0

## 2024-05-18 ENCOUNTER — Other Ambulatory Visit (HOSPITAL_COMMUNITY): Payer: Self-pay

## 2024-06-08 ENCOUNTER — Other Ambulatory Visit (HOSPITAL_COMMUNITY): Payer: Self-pay

## 2024-06-08 MED ORDER — WEGOVY 2.4 MG/0.75ML ~~LOC~~ SOAJ
2.4000 mg | SUBCUTANEOUS | 0 refills | Status: AC
  Filled 2024-06-08: qty 3, 28d supply, fill #0

## 2024-06-09 ENCOUNTER — Other Ambulatory Visit (HOSPITAL_COMMUNITY): Payer: Self-pay

## 2024-06-09 MED ORDER — BUPROPION HCL ER (SR) 100 MG PO TB12
100.0000 mg | ORAL_TABLET | Freq: Two times a day (BID) | ORAL | 1 refills | Status: AC
  Filled 2024-06-09: qty 60, 30d supply, fill #0

## 2024-06-15 ENCOUNTER — Other Ambulatory Visit (HOSPITAL_COMMUNITY): Payer: Self-pay

## 2024-07-06 ENCOUNTER — Other Ambulatory Visit (HOSPITAL_COMMUNITY): Payer: Self-pay

## 2024-07-06 MED ORDER — WEGOVY 2.4 MG/0.75ML ~~LOC~~ SOAJ
SUBCUTANEOUS | 0 refills | Status: AC
  Filled 2024-07-06: qty 3, 28d supply, fill #0

## 2024-07-06 MED ORDER — BUPROPION HCL ER (SR) 100 MG PO TB12
100.0000 mg | ORAL_TABLET | Freq: Two times a day (BID) | ORAL | 1 refills | Status: AC
  Filled 2024-07-06: qty 60, 30d supply, fill #0

## 2024-07-13 ENCOUNTER — Other Ambulatory Visit (HOSPITAL_COMMUNITY): Payer: Self-pay

## 2024-07-13 ENCOUNTER — Other Ambulatory Visit: Payer: Self-pay

## 2024-07-26 ENCOUNTER — Other Ambulatory Visit (HOSPITAL_COMMUNITY): Payer: Self-pay

## 2024-08-07 ENCOUNTER — Other Ambulatory Visit (HOSPITAL_COMMUNITY): Payer: Self-pay

## 2024-08-07 MED ORDER — WEGOVY 2.4 MG/0.75ML ~~LOC~~ SOAJ
2.4000 mg | SUBCUTANEOUS | 0 refills | Status: AC
  Filled 2024-08-07 – 2024-08-18 (×2): qty 3, 28d supply, fill #0

## 2024-08-17 ENCOUNTER — Other Ambulatory Visit (HOSPITAL_COMMUNITY): Payer: Self-pay

## 2024-08-18 ENCOUNTER — Other Ambulatory Visit (HOSPITAL_COMMUNITY): Payer: Self-pay

## 2024-09-18 ENCOUNTER — Other Ambulatory Visit (HOSPITAL_COMMUNITY): Payer: Self-pay

## 2024-09-18 ENCOUNTER — Other Ambulatory Visit: Payer: Self-pay

## 2024-09-18 MED ORDER — ACCRUFER 30 MG PO CAPS
30.0000 mg | ORAL_CAPSULE | Freq: Two times a day (BID) | ORAL | 1 refills | Status: AC
  Filled 2024-09-18 – 2024-10-08 (×2): qty 180, 90d supply, fill #0

## 2024-09-18 MED ORDER — WEGOVY 2.4 MG/0.75ML ~~LOC~~ SOAJ
2.4000 mg | SUBCUTANEOUS | 0 refills | Status: AC
  Filled 2024-09-18 – 2024-10-08 (×2): qty 3, 28d supply, fill #0

## 2024-09-18 MED ORDER — BUPROPION HCL ER (SR) 100 MG PO TB12
100.0000 mg | ORAL_TABLET | Freq: Two times a day (BID) | ORAL | 1 refills | Status: AC
  Filled 2024-09-18 – 2024-10-08 (×2): qty 60, 30d supply, fill #0
  Filled 2024-10-10: qty 180, 90d supply, fill #0

## 2024-09-19 ENCOUNTER — Other Ambulatory Visit (HOSPITAL_COMMUNITY): Payer: Self-pay

## 2024-09-25 ENCOUNTER — Other Ambulatory Visit (HOSPITAL_COMMUNITY): Payer: Self-pay

## 2024-10-01 ENCOUNTER — Other Ambulatory Visit (HOSPITAL_COMMUNITY): Payer: Self-pay

## 2024-10-02 ENCOUNTER — Other Ambulatory Visit (HOSPITAL_COMMUNITY): Payer: Self-pay

## 2024-10-08 ENCOUNTER — Other Ambulatory Visit (HOSPITAL_COMMUNITY): Payer: Self-pay

## 2024-10-10 ENCOUNTER — Other Ambulatory Visit (HOSPITAL_COMMUNITY): Payer: Self-pay

## 2024-10-10 ENCOUNTER — Other Ambulatory Visit: Payer: Self-pay

## 2024-10-11 ENCOUNTER — Other Ambulatory Visit (HOSPITAL_COMMUNITY): Payer: Self-pay

## 2024-10-22 ENCOUNTER — Other Ambulatory Visit (HOSPITAL_COMMUNITY): Payer: Self-pay
